# Patient Record
Sex: Female | Born: 1995 | Race: White | Hispanic: No | Marital: Single
Health system: Southern US, Community
[De-identification: ages and names within clinical notes are randomized; demographics above are authoritative.]

---

## 2014-02-24 ENCOUNTER — Emergency Department: Payer: Self-pay | Admitting: Emergency Medicine

## 2014-02-24 LAB — COMPREHENSIVE METABOLIC PANEL
ALBUMIN: 3.8 g/dL (ref 3.8–5.6)
ALT: 15 U/L (ref 12–78)
ANION GAP: 5 — AB (ref 7–16)
Alkaline Phosphatase: 70 U/L
BILIRUBIN TOTAL: 0.4 mg/dL (ref 0.2–1.0)
BUN: 9 mg/dL (ref 7–18)
CHLORIDE: 106 mmol/L (ref 98–107)
CO2: 27 mmol/L (ref 21–32)
CREATININE: 0.9 mg/dL (ref 0.60–1.30)
Calcium, Total: 8.4 mg/dL — ABNORMAL LOW (ref 9.0–10.7)
EGFR (Non-African Amer.): >60
GLUCOSE: 144 mg/dL — AB (ref 65–99)
OSMOLALITY: 277 (ref 275–301)
POTASSIUM: 3.3 mmol/L — AB (ref 3.5–5.1)
SGOT(AST): 16 U/L (ref 0–26)
Sodium: 138 mmol/L (ref 136–145)
TOTAL PROTEIN: 7.4 g/dL (ref 6.4–8.6)

## 2014-02-24 LAB — CBC
HCT: 33.3 % — ABNORMAL LOW (ref 35.0–47.0)
HGB: 11.3 g/dL — ABNORMAL LOW (ref 12.0–16.0)
MCH: 29.1 pg (ref 26.0–34.0)
MCHC: 33.9 g/dL (ref 32.0–36.0)
MCV: 86 fL (ref 80–100)
Platelet: 166 10*3/uL (ref 150–440)
RBC: 3.88 10*6/uL (ref 3.80–5.20)
RDW: 13.3 % (ref 11.5–14.5)
WBC: 11 10*3/uL (ref 3.6–11.0)

## 2014-02-24 LAB — URINALYSIS, COMPLETE
BILIRUBIN, UR: NEGATIVE
Glucose,UR: NEGATIVE mg/dL (ref 0–75)
Ketone: NEGATIVE
Leukocyte Esterase: NEGATIVE
Nitrite: NEGATIVE
PROTEIN: NEGATIVE
Ph: 6 (ref 4.5–8.0)
RBC,UR: 207 /HPF (ref 0–5)
Specific Gravity: 1.004 (ref 1.003–1.030)
WBC UR: 2 /HPF (ref 0–5)

## 2014-02-24 LAB — LIPASE, BLOOD: Lipase: 129 U/L (ref 73–393)

## 2015-01-16 IMAGING — US ABDOMEN ULTRASOUND LIMITED
1 series · 14 of 25 positions shown · non-contrast
Comparison: None.

CLINICAL DATA: Epigastric and right upper quadrant abdominal pain.

EXAM:
US ABDOMEN LIMITED - RIGHT UPPER QUADRANT

[Series 1: abdomen ultrasound limited · 0.24mm/px · 14 of 34 slices shown]
[im 1/34]
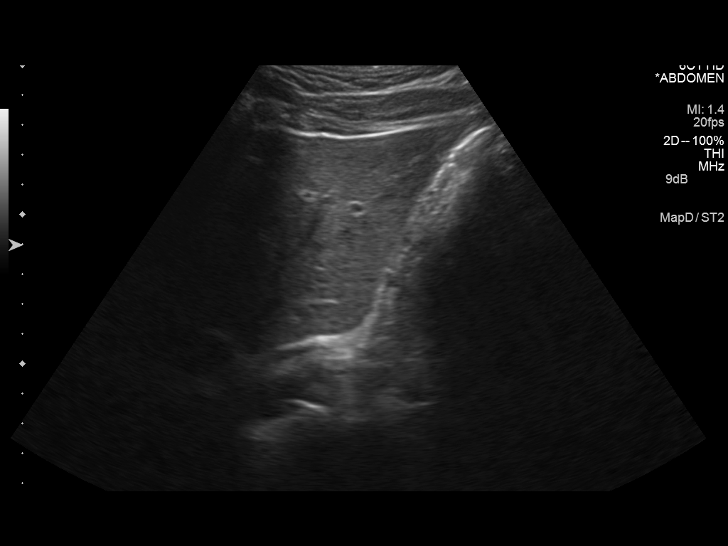
[im 3/34]
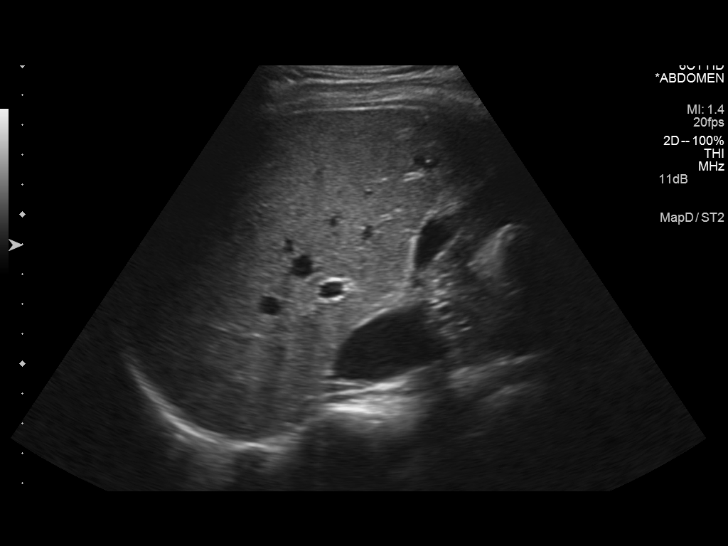
[im 6/34]
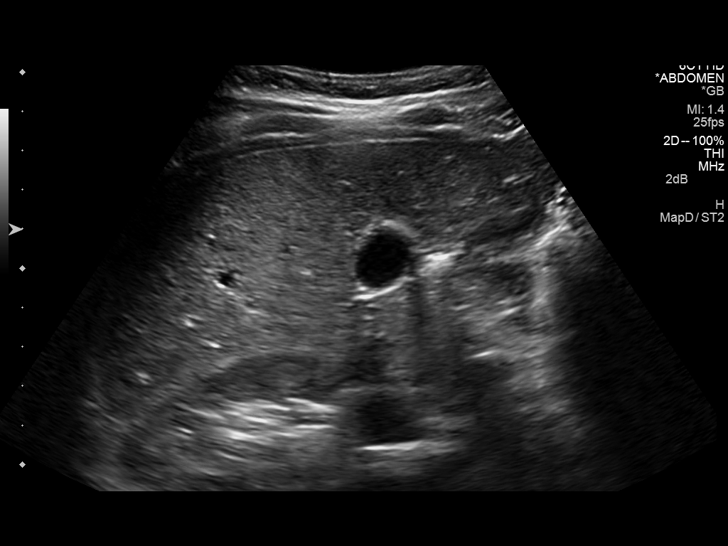
[im 9/34]
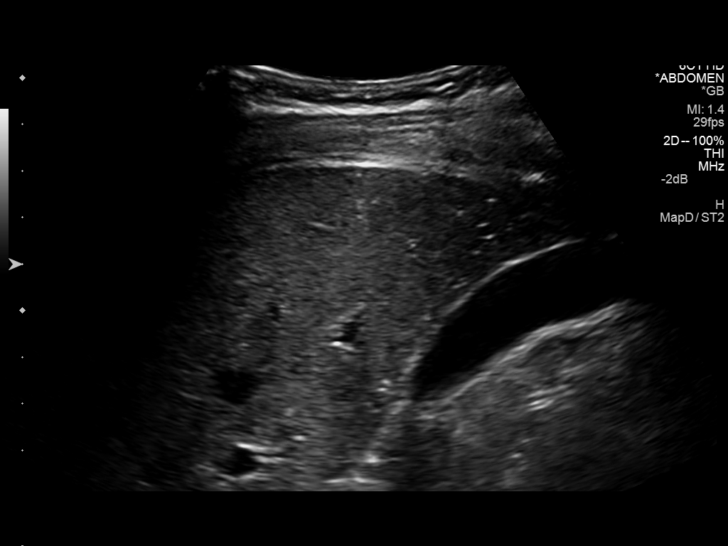
[im 12/34]
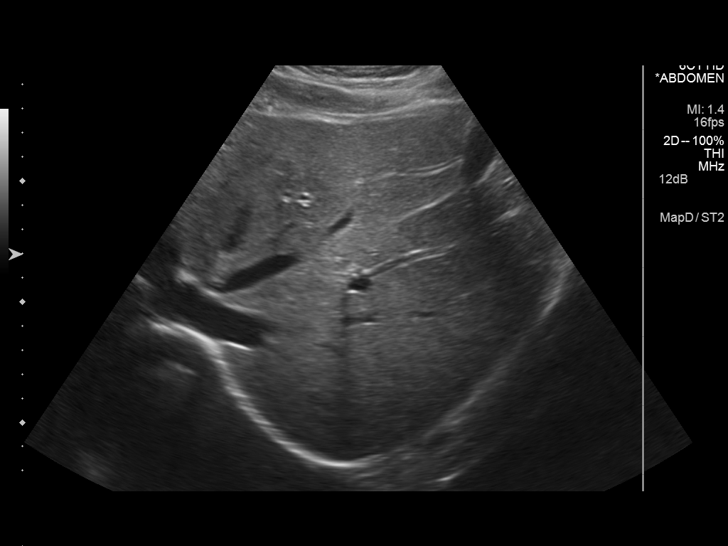
[im 13/34]
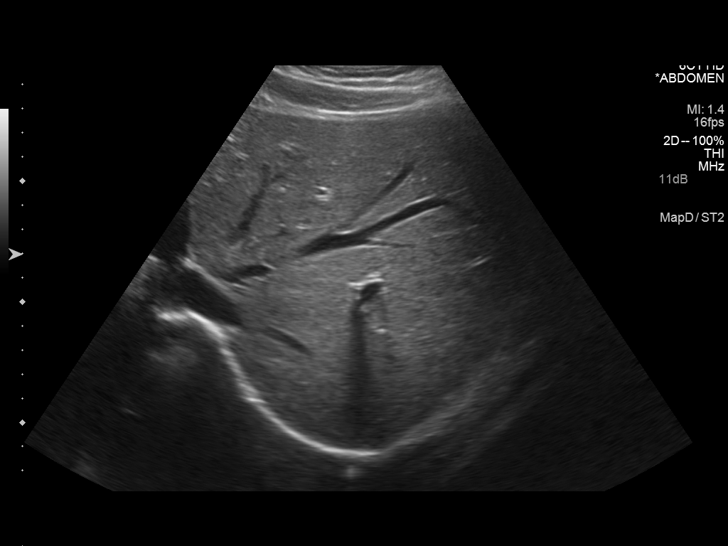
[im 16/34]
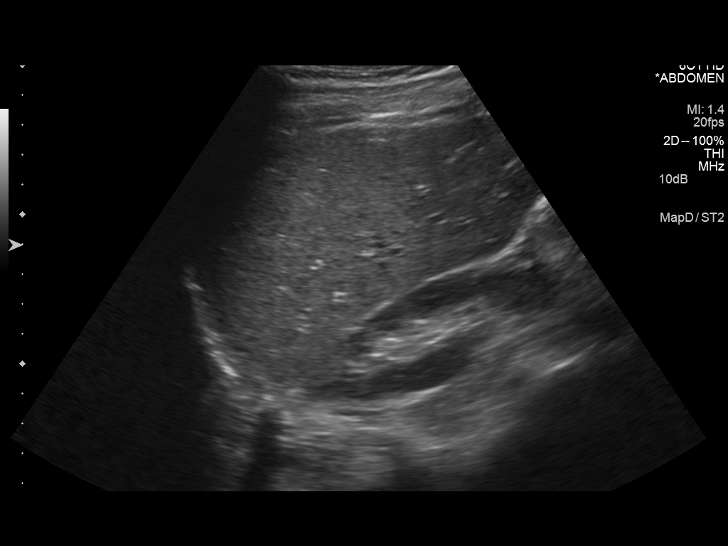
[im 18/34]
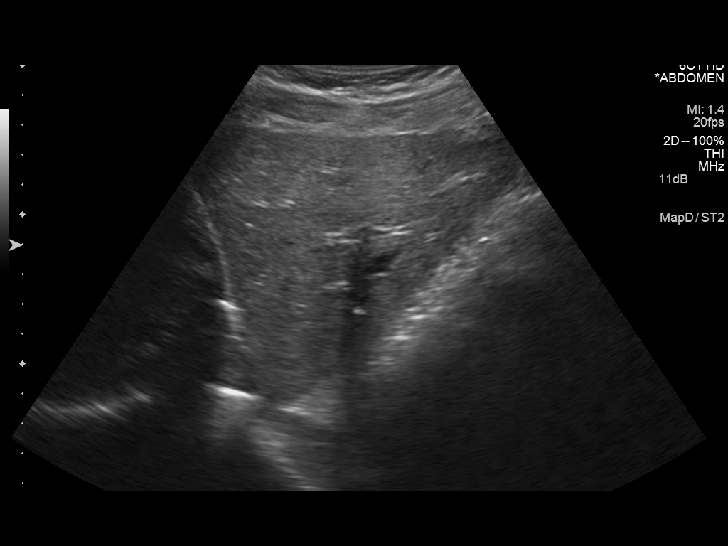
[im 21/34]
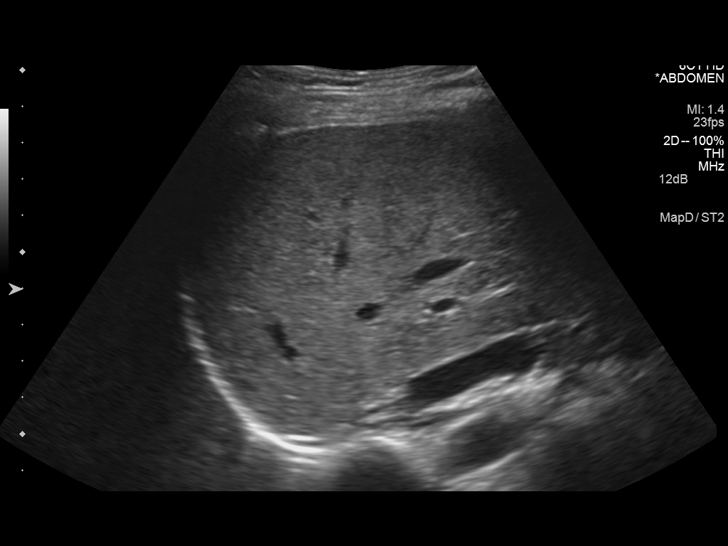
[im 23/34]
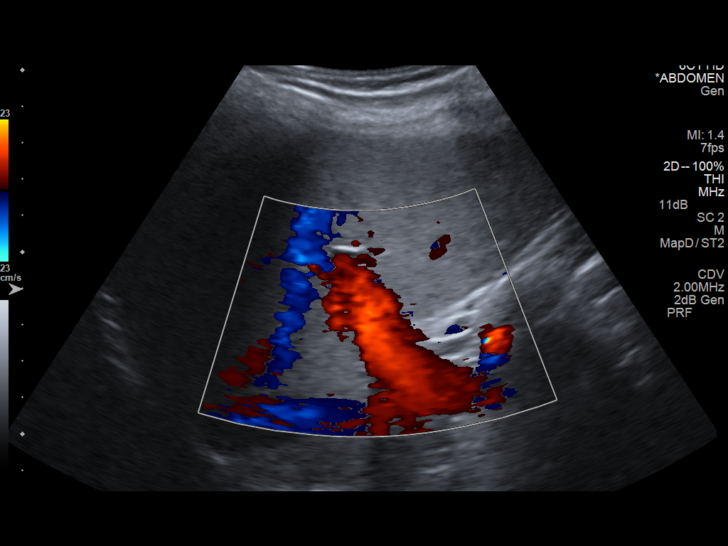
[im 25/34]
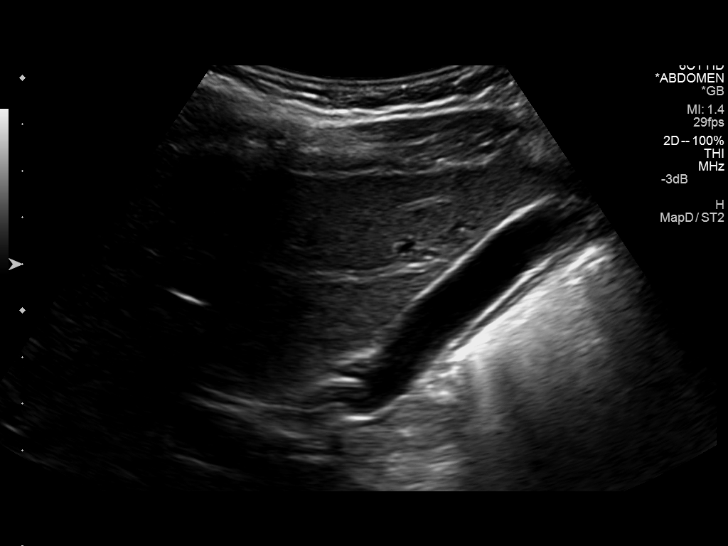
[im 28/34]
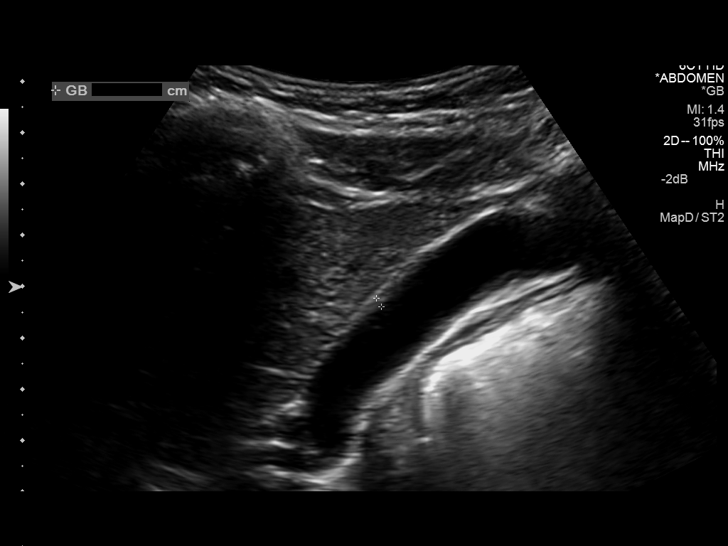
[im 31/34]
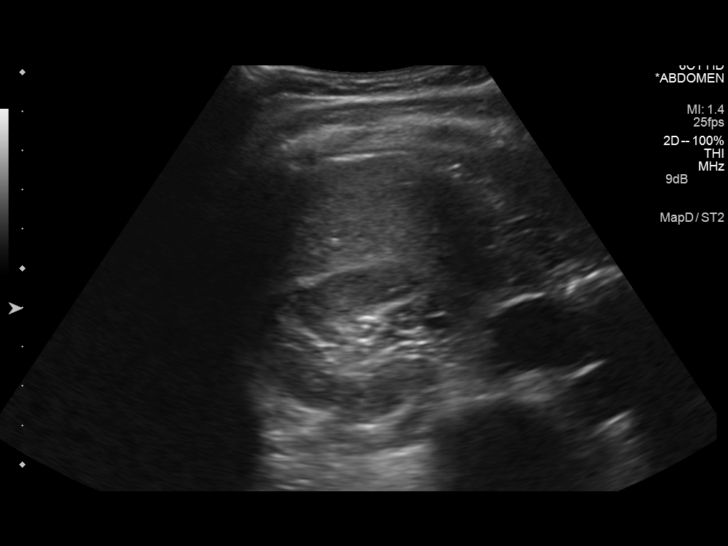
[im 34/34]
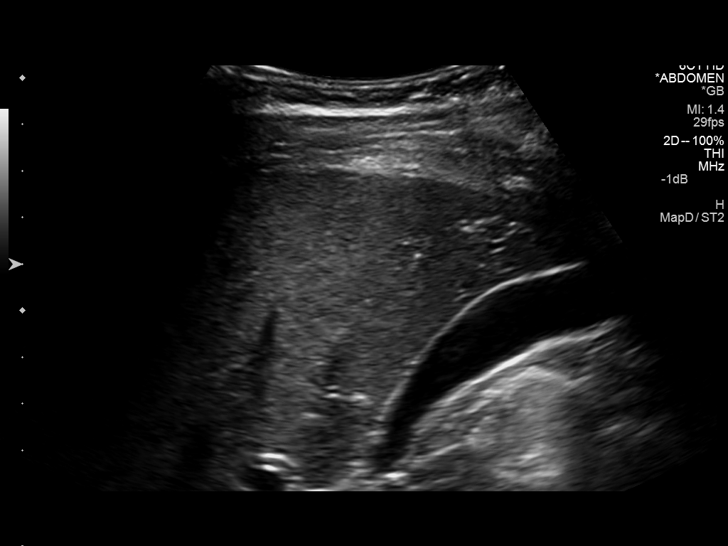

[14 of 25 positions shown; findings below may reference images not displayed]

FINDINGS: Gallbladder:

No gallstones or wall thickening visualized. No sonographic Murphy
sign noted.

Common bile duct:

Diameter: 0.2 cm, within normal limits in caliber.

Liver:

No focal lesion identified. Within normal limits in parenchymal
echogenicity.
IMPRESSION: Unremarkable ultrasound of the right upper quadrant.

## 2015-03-13 NOTE — H&P (Signed)
   Subjective/Chief Complaint Epigastric/RUQ pain, now RLQ pain   History of Present Illness Jillian Townsend is a pleasant 19 yo F who presents with 1 day of abdominal pain.  Initially began as epigastric, then RUQ.  Now is predominantly tender to palpation RLQ.  + nausea without emesis.  Anorexia.  No diarrhea.  No fevers/chills.  No sick contacts (friend with mono 3 weeks ago however), no unusual ingestions   Past Med/Surgical Hx:  GERD:   ALLERGIES:  No Known Allergies:   Family and Social History:  Family History Negative  Gm with history of cholecystitis   Social History negative tobacco, negative ETOH   [employer]   Review of Systems:  Subjective/Chief Complaint epigastric,RUQ/RLQ pain   Fever/Chills No   Cough No   Sputum No   Abdominal Pain Yes   Diarrhea No   Constipation No   Nausea/Vomiting Yes   SOB/DOE No   Chest Pain No   Dysuria No   Tolerating Diet Nauseated  Anorexia   Physical Exam:  GEN well developed, well nourished, no acute distress   HEENT pink conjunctivae, PERRL, good dentition   RESP normal resp effort  clear BS  no use of accessory muscles   CARD regular rate  no murmur   ABD positive tenderness  no liver/spleen enlargement  no hernia  soft  normal BS  Tender RLQ, nonperitoneal   EXTR negative cyanosis/clubbing, negative edema   SKIN No rashes, No ulcers   NEURO cranial nerves intact, negative rigidity, negative tremor, follows commands   PSYCH A+O to time, place, person, good insight    Assessment/Admission Diagnosis Epigastric, RUQ/RLQ pain.  WBC 11.  Appendix mildly dilated and thickened without significant periappendiceal stranding but minimal intraabdominal fat.  Possibly early appendicitis.   Plan Will discuss with daytime surgeon Dr. Michela PitcherEly regarding imaging and HPI.  He will decide possible appendectomy based on his findings.   Electronic Signatures: Jarvis NewcomerLundquist, Kinda Pottle A (MD)  (Signed 07-Apr-15  06:52)  Authored: CHIEF COMPLAINT and HISTORY, PAST MEDICAL/SURGIAL HISTORY, ALLERGIES, FAMILY AND SOCIAL HISTORY, REVIEW OF SYSTEMS, PHYSICAL EXAM, ASSESSMENT AND PLAN   Last Updated: 07-Apr-15 06:52 by Jarvis NewcomerLundquist, Alfhild Partch A (MD)
